# Patient Record
Sex: Male | Born: 1950 | Race: White | Hispanic: No | Marital: Single | Smoking: Current every day smoker
Health system: Southern US, Community
[De-identification: ages and names within clinical notes are randomized; demographics above are authoritative.]

## PROBLEM LIST (undated history)

## (undated) DIAGNOSIS — I509 Heart failure, unspecified: Secondary | ICD-10-CM

## (undated) DIAGNOSIS — I10 Essential (primary) hypertension: Secondary | ICD-10-CM

---

## 2021-08-17 ENCOUNTER — Emergency Department (HOSPITAL_COMMUNITY): Payer: Medicare HMO

## 2021-08-17 ENCOUNTER — Emergency Department (HOSPITAL_COMMUNITY)
Admission: EM | Admit: 2021-08-17 | Discharge: 2021-08-18 | Disposition: A | Discharge status: Home / Self Care | Payer: Medicare HMO | Attending: Emergency Medicine | Admitting: Emergency Medicine

## 2021-08-17 ENCOUNTER — Other Ambulatory Visit: Payer: Self-pay

## 2021-08-17 ENCOUNTER — Encounter (HOSPITAL_COMMUNITY): Payer: Self-pay | Admitting: Oncology

## 2021-08-17 DIAGNOSIS — R2243 Localized swelling, mass and lump, lower limb, bilateral: Secondary | ICD-10-CM | POA: Diagnosis not present

## 2021-08-17 DIAGNOSIS — I509 Heart failure, unspecified: Secondary | ICD-10-CM | POA: Diagnosis not present

## 2021-08-17 DIAGNOSIS — J3489 Other specified disorders of nose and nasal sinuses: Secondary | ICD-10-CM | POA: Diagnosis present

## 2021-08-17 DIAGNOSIS — F17210 Nicotine dependence, cigarettes, uncomplicated: Secondary | ICD-10-CM | POA: Insufficient documentation

## 2021-08-17 DIAGNOSIS — R220 Localized swelling, mass and lump, head: Secondary | ICD-10-CM | POA: Diagnosis not present

## 2021-08-17 DIAGNOSIS — I110 Hypertensive heart disease with heart failure: Secondary | ICD-10-CM | POA: Insufficient documentation

## 2021-08-17 HISTORY — DX: Heart failure, unspecified: I50.9

## 2021-08-17 HISTORY — DX: Essential (primary) hypertension: I10

## 2021-08-17 LAB — BRAIN NATRIURETIC PEPTIDE: B Natriuretic Peptide: 324.2 pg/mL — ABNORMAL HIGH (ref 0.0–100.0)

## 2021-08-17 LAB — COMPREHENSIVE METABOLIC PANEL
ALT: 13 U/L (ref 0–44)
AST: 20 U/L (ref 15–41)
Albumin: 3.7 g/dL (ref 3.5–5.0)
Alkaline Phosphatase: 79 U/L (ref 38–126)
Anion gap: 7 (ref 5–15)
BUN: 14 mg/dL (ref 8–23)
CO2: 26 mmol/L (ref 22–32)
Calcium: 9 mg/dL (ref 8.9–10.3)
Chloride: 107 mmol/L (ref 98–111)
Creatinine, Ser: 1.15 mg/dL (ref 0.61–1.24)
GFR, Estimated: >60 mL/min (ref >60)
Glucose, Bld: 102 mg/dL — ABNORMAL HIGH (ref 70–99)
Potassium: 4.2 mmol/L (ref 3.5–5.1)
Sodium: 140 mmol/L (ref 135–145)
Total Bilirubin: 1.2 mg/dL (ref 0.3–1.2)
Total Protein: 7.5 g/dL (ref 6.5–8.1)

## 2021-08-17 LAB — CBC WITH DIFFERENTIAL/PLATELET
Abs Immature Granulocytes: 0.02 10*3/uL (ref 0.00–0.07)
Basophils Absolute: 0 10*3/uL (ref 0.0–0.1)
Basophils Relative: 1 %
Eosinophils Absolute: 0.2 10*3/uL (ref 0.0–0.5)
Eosinophils Relative: 3 %
HCT: 35.6 % — ABNORMAL LOW (ref 39.0–52.0)
Hemoglobin: 11.2 g/dL — ABNORMAL LOW (ref 13.0–17.0)
Immature Granulocytes: 0 %
Lymphocytes Relative: 15 %
Lymphs Abs: 1 10*3/uL (ref 0.7–4.0)
MCH: 29.7 pg (ref 26.0–34.0)
MCHC: 31.5 g/dL (ref 30.0–36.0)
MCV: 94.4 fL (ref 80.0–100.0)
Monocytes Absolute: 0.4 10*3/uL (ref 0.1–1.0)
Monocytes Relative: 6 %
Neutro Abs: 5.2 10*3/uL (ref 1.7–7.7)
Neutrophils Relative %: 75 %
Platelets: 271 10*3/uL (ref 150–400)
RBC: 3.77 MIL/uL — ABNORMAL LOW (ref 4.22–5.81)
RDW: 14.1 % (ref 11.5–15.5)
WBC: 6.9 10*3/uL (ref 4.0–10.5)
nRBC: 0 % (ref 0.0–0.2)

## 2021-08-17 MED ORDER — VITAMIN D (ERGOCALCIFEROL) 1.25 MG (50000 UNIT) PO CAPS: 50000.0000 [IU] | ORAL_CAPSULE | ORAL | Status: DC

## 2021-08-17 MED ORDER — AMLODIPINE BESYLATE 5 MG PO TABS
5.0000 mg | ORAL_TABLET | Freq: Every day | ORAL | Status: DC
  Administered 2021-08-18: 5 mg via ORAL
  Filled 2021-08-17: qty 1

## 2021-08-17 MED ORDER — LISINOPRIL 20 MG PO TABS
40.0000 mg | ORAL_TABLET | Freq: Every day | ORAL | Status: DC
  Administered 2021-08-18: 40 mg via ORAL
  Filled 2021-08-17: qty 2

## 2021-08-17 MED ORDER — SERTRALINE HCL 50 MG PO TABS
100.0000 mg | ORAL_TABLET | Freq: Every day | ORAL | Status: DC
  Administered 2021-08-18: 100 mg via ORAL
  Filled 2021-08-17: qty 2

## 2021-08-17 MED ORDER — FUROSEMIDE 40 MG PO TABS: 20.0000 mg | ORAL_TABLET | Freq: Every day | ORAL | Status: DC | PRN
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
## 2021-08-17 NOTE — Progress Notes (Signed)
TOC CSW contacted [employer] of the [place] [phone].  They currently do not have availability in [place] or in [place].  Durene Dodge Tarpley-Carter, MSW, LCSW-A Pronouns:  She/Her/Hers [facility] Care Clinical Social Worker Direct Number:  [phone] [email or web address]
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
## 2021-08-18 DIAGNOSIS — J3489 Other specified disorders of nose and nasal sinuses: Secondary | ICD-10-CM | POA: Diagnosis not present

## 2021-08-18 MED ORDER — ACETAMINOPHEN 325 MG PO TABS: 650.0000 mg | ORAL_TABLET | Freq: Four times a day (QID) | ORAL | Status: DC | PRN

## 2021-08-18 MED ORDER — ONDANSETRON HCL 4 MG PO TABS: 4.0000 mg | ORAL_TABLET | Freq: Four times a day (QID) | ORAL | Status: DC | PRN

## 2021-08-18 MED ORDER — ONDANSETRON HCL 4 MG/2ML IJ SOLN: 4.0000 mg | Freq: Four times a day (QID) | INTRAMUSCULAR | Status: DC | PRN

## 2021-08-18 MED ORDER — TRAZODONE HCL 50 MG PO TABS: 25.0000 mg | ORAL_TABLET | Freq: Every evening | ORAL | Status: DC | PRN

## 2021-08-18 MED ORDER — ACETAMINOPHEN 650 MG RE SUPP: 650.0000 mg | Freq: Four times a day (QID) | RECTAL | Status: DC | PRN

## 2021-08-18 NOTE — ED Notes (Signed)
Pt provided with coffee and a phone. Alert oriented and comfortable.

## 2021-08-18 NOTE — ED Notes (Signed)
Social worker at bedside.
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
## 2021-08-18 NOTE — ED Provider Notes (Signed)
SW contacted friend who will come pick up patient, provide ride back to hotel to gather things and take him to friend's place.  Shelter resources provided to patient as well.   Terald Sleeper, MD 08/18/21 [phone]
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
## 2023-10-13 ENCOUNTER — Emergency Department (HOSPITAL_COMMUNITY)
Admission: EM | Admit: 2023-10-13 | Discharge: 2023-10-13 | Disposition: A | Discharge status: Home / Self Care | Payer: 59 | Attending: Emergency Medicine | Admitting: Emergency Medicine

## 2023-10-13 ENCOUNTER — Emergency Department (HOSPITAL_COMMUNITY): Payer: 59

## 2023-10-13 DIAGNOSIS — Z79899 Other long term (current) drug therapy: Secondary | ICD-10-CM | POA: Diagnosis not present

## 2023-10-13 DIAGNOSIS — Z720 Tobacco use: Secondary | ICD-10-CM

## 2023-10-13 DIAGNOSIS — R0602 Shortness of breath: Secondary | ICD-10-CM | POA: Diagnosis present

## 2023-10-13 DIAGNOSIS — Z7901 Long term (current) use of anticoagulants: Secondary | ICD-10-CM | POA: Diagnosis not present

## 2023-10-13 DIAGNOSIS — J40 Bronchitis, not specified as acute or chronic: Secondary | ICD-10-CM | POA: Diagnosis not present

## 2023-10-13 DIAGNOSIS — I110 Hypertensive heart disease with heart failure: Secondary | ICD-10-CM | POA: Insufficient documentation

## 2023-10-13 DIAGNOSIS — I509 Heart failure, unspecified: Secondary | ICD-10-CM | POA: Diagnosis not present

## 2023-10-13 DIAGNOSIS — F17200 Nicotine dependence, unspecified, uncomplicated: Secondary | ICD-10-CM | POA: Diagnosis not present

## 2023-10-13 DIAGNOSIS — Z20822 Contact with and (suspected) exposure to covid-19: Secondary | ICD-10-CM | POA: Diagnosis not present

## 2023-10-13 LAB — CBC WITH DIFFERENTIAL/PLATELET
Abs Immature Granulocytes: 0.01 10*3/uL (ref 0.00–0.07)
Basophils Absolute: 0 10*3/uL (ref 0.0–0.1)
Basophils Relative: 1 %
Eosinophils Absolute: 0.2 10*3/uL (ref 0.0–0.5)
Eosinophils Relative: 3 %
HCT: 33 % — ABNORMAL LOW (ref 39.0–52.0)
Hemoglobin: 10.5 g/dL — ABNORMAL LOW (ref 13.0–17.0)
Immature Granulocytes: 0 %
Lymphocytes Relative: 24 %
Lymphs Abs: 1.1 10*3/uL (ref 0.7–4.0)
MCH: 31 pg (ref 26.0–34.0)
MCHC: 31.8 g/dL (ref 30.0–36.0)
MCV: 97.3 fL (ref 80.0–100.0)
Monocytes Absolute: 0.4 10*3/uL (ref 0.1–1.0)
Monocytes Relative: 7 %
Neutro Abs: 3 10*3/uL (ref 1.7–7.7)
Neutrophils Relative %: 65 %
Platelets: 211 10*3/uL (ref 150–400)
RBC: 3.39 MIL/uL — ABNORMAL LOW (ref 4.22–5.81)
RDW: 13.7 % (ref 11.5–15.5)
WBC: 4.7 10*3/uL (ref 4.0–10.5)
nRBC: 0 % (ref 0.0–0.2)

## 2023-10-13 LAB — COMPREHENSIVE METABOLIC PANEL
ALT: 10 U/L (ref 0–44)
AST: 15 U/L (ref 15–41)
Albumin: 3.3 g/dL — ABNORMAL LOW (ref 3.5–5.0)
Alkaline Phosphatase: 53 U/L (ref 38–126)
Anion gap: 8 (ref 5–15)
BUN: 16 mg/dL (ref 8–23)
CO2: 26 mmol/L (ref 22–32)
Calcium: 9.1 mg/dL (ref 8.9–10.3)
Chloride: 104 mmol/L (ref 98–111)
Creatinine, Ser: 1.1 mg/dL (ref 0.61–1.24)
GFR, Estimated: >60 mL/min (ref >60)
Glucose, Bld: 92 mg/dL (ref 70–99)
Potassium: 4.5 mmol/L (ref 3.5–5.1)
Sodium: 138 mmol/L (ref 135–145)
Total Bilirubin: 0.7 mg/dL (ref 0.0–1.2)
Total Protein: 6.8 g/dL (ref 6.5–8.1)

## 2023-10-13 LAB — RESP PANEL BY RT-PCR (RSV, FLU A&B, COVID)  RVPGX2
Influenza A by PCR: NEGATIVE
Influenza B by PCR: NEGATIVE
Resp Syncytial Virus by PCR: NEGATIVE
SARS Coronavirus 2 by RT PCR: NEGATIVE

## 2023-10-13 MED ORDER — METHYLPREDNISOLONE SODIUM SUCC 125 MG IJ SOLR
125.0000 mg | Freq: Once | INTRAMUSCULAR | Status: AC
  Administered 2023-10-13: 125 mg via INTRAVENOUS
  Filled 2023-10-13: qty 2

## 2023-10-13 MED ORDER — PREDNISONE 50 MG PO TABS: 50.0000 mg | ORAL_TABLET | Freq: Every day | ORAL | 0 refills | Status: DC

## 2023-10-13 MED ORDER — GUAIFENESIN 100 MG/5ML PO LIQD: 100.0000 mg | ORAL | 0 refills | Status: DC | PRN

## 2023-10-13 MED ORDER — GUAIFENESIN-CODEINE 100-10 MG/5ML PO SOLN: 5.0000 mL | Freq: Three times a day (TID) | ORAL | 0 refills | Status: AC | PRN

## 2023-10-13 MED ORDER — DOXYCYCLINE HYCLATE 100 MG PO CAPS: 100.0000 mg | ORAL_CAPSULE | Freq: Two times a day (BID) | ORAL | 0 refills | Status: DC

## 2023-10-13 MED ORDER — DOXYCYCLINE HYCLATE 100 MG PO CAPS: 100.0000 mg | ORAL_CAPSULE | Freq: Two times a day (BID) | ORAL | 0 refills | Status: AC

## 2023-10-13 MED ORDER — ALBUTEROL SULFATE HFA 108 (90 BASE) MCG/ACT IN AERS: 2.0000 | INHALATION_SPRAY | RESPIRATORY_TRACT | Status: DC | PRN

## 2023-10-13 MED ORDER — IPRATROPIUM-ALBUTEROL 0.5-2.5 (3) MG/3ML IN SOLN
3.0000 mL | Freq: Once | RESPIRATORY_TRACT | Status: AC
  Administered 2023-10-13: 3 mL via RESPIRATORY_TRACT
  Filled 2023-10-13: qty 3

## 2023-10-13 MED ORDER — PREDNISONE 50 MG PO TABS: 50.0000 mg | ORAL_TABLET | Freq: Every day | ORAL | 0 refills | Status: AC

## 2023-10-13 MED ORDER — GUAIFENESIN 100 MG/5ML PO LIQD: 100.0000 mg | ORAL | 0 refills | Status: AC | PRN

## 2023-10-13 MED ORDER — DOXYCYCLINE HYCLATE 100 MG PO TABS
100.0000 mg | ORAL_TABLET | Freq: Once | ORAL | Status: AC
  Administered 2023-10-13: 100 mg via ORAL
  Filled 2023-10-13: qty 1

## 2023-10-13 NOTE — Discharge Instructions (Addendum)
Try to stop smoking. °

## 2023-10-13 NOTE — ED Triage Notes (Signed)
 Pt arrives via [facility] from hotel and reports for the last 1-2 weeks having URI symptoms including cough, congestion, malaise and SOB. Pt denies CP during triage. No recent fevers per pt.

## 2023-10-13 NOTE — ED Provider Notes (Signed)
 [facility] Provider Note   CSN: [id number] Arrival date & time: 10/13/23  9072     History  Chief Complaint  Patient presents with   URI   Shortness of Breath    Adam Baldwin is a 73 y.o. male.  Pt is a 73 yo male with pmhx significant for htn, chf, schizophrenia, seizures, and homeless.  Pt has been living in a hotel and has been coughing for 2 weeks.  He denies fevers.  He does smoke. No known sick contracts.         Home Medications Prior to Admission medications   Medication Sig Start Date End Date Taking? Authorizing Provider  guaiFENesin -codeine  100-10 MG/5ML syrup Take 5 mLs by mouth 3 (three) times daily as needed for cough. 10/13/23  Yes Dean Clarity, MD  amLODipine  (NORVASC ) 5 MG tablet Take 5 mg by mouth daily. 03/08/21   [provider]  doxycycline  (VIBRAMYCIN ) 100 MG capsule Take 1 capsule (100 mg total) by mouth 2 (two) times daily. 10/13/23   Dean Clarity, MD  ELIQUIS 5 MG TABS tablet Take 5 mg by mouth 2 (two) times daily. 04/03/21   [provider]  ergocalciferol  (VITAMIN D2) 1.25 MG (50000 UT) capsule Take 50,000 Units by mouth every Tuesday. 06/21/19   [provider]  furosemide  (LASIX ) 20 MG tablet Take 20 mg by mouth daily as needed (for swelling in legs/feet). 08/11/21   [provider]  guaiFENesin  (ROBITUSSIN) 100 MG/5ML liquid Take 5-10 mLs (100-200 mg total) by mouth every 4 (four) hours as needed for cough or to loosen phlegm. 10/13/23   Dean Clarity, MD  lisinopril  (ZESTRIL ) 40 MG tablet Take 40 mg by mouth daily. 06/10/21   [provider]  predniSONE  (DELTASONE ) 50 MG tablet Take 1 tablet (50 mg total) by mouth daily with breakfast. 10/13/23   Dean Clarity, MD  sertraline  (ZOLOFT ) 100 MG tablet Take 100 mg by mouth daily. 08/13/21   [provider]      Allergies    Patient has no known allergies.    Review of Systems   Review of Systems   Respiratory:  Positive for cough and shortness of breath.   All other systems reviewed and are negative.   Physical Exam Updated Vital Signs BP (!) 185/83 (BP Location: Right Arm)   Pulse 61   Temp 98.1 F (36.7 C)   Resp 18   SpO2 100%  Physical Exam Vitals and nursing note reviewed.  Constitutional:      Appearance: He is well-developed.  HENT:     Head: Normocephalic and atraumatic.     Mouth/Throat:     Mouth: Mucous membranes are moist.     Pharynx: Oropharynx is clear.  Eyes:     Extraocular Movements: Extraocular movements intact.     Pupils: Pupils are equal, round, and reactive to light.  Cardiovascular:     Rate and Rhythm: Normal rate and regular rhythm.  Pulmonary:     Effort: Pulmonary effort is normal.     Breath sounds: Wheezing present.  Abdominal:     General: Bowel sounds are normal.     Palpations: Abdomen is soft.  Musculoskeletal:        General: Normal range of motion.     Cervical back: Normal range of motion and neck supple.  Skin:    General: Skin is warm.     Capillary Refill: Capillary refill takes less than 2 seconds.  Neurological:  General: No focal deficit present.     Mental Status: He is alert and oriented to person, place, and time.  Psychiatric:        Mood and Affect: Mood normal.        Behavior: Behavior normal.     ED Results / Procedures / Treatments   Labs (all labs ordered are listed, but only abnormal results are displayed) Labs Reviewed  COMPREHENSIVE METABOLIC PANEL - Abnormal; Notable for the following components:      Result Value   Albumin 3.3 (*)    All other components within normal limits  CBC WITH DIFFERENTIAL/PLATELET - Abnormal; Notable for the following components:   RBC 3.39 (*)    Hemoglobin 10.5 (*)    HCT 33.0 (*)    All other components within normal limits  RESP PANEL BY RT-PCR (RSV, FLU A&B, COVID)  [id number]    EKG EKG Interpretation Date/Time:  Sunday October 13 2023 09:44:59  EST Ventricular Rate:  60 PR Interval:  148 QRS Duration:  94 QT Interval:  428 QTC Calculation: 428 R Axis:   75  Text Interpretation: Sinus rhythm with marked sinus arrhythmia Otherwise normal ECG No previous ECGs available Confirmed by Dean Clarity [phone]) on 10/13/2023 11:33:38 AM  Radiology DG Chest 2 View Result Date: 10/13/2023 CLINICAL DATA:  Shortness of breath.  URI EXAM: CHEST - 2 VIEW COMPARISON:  08/17/2021 FINDINGS: Hyperinflation on the lateral view. There is no edema, consolidation, effusion, or pneumothorax. Normal heart size and mediastinal contours. IMPRESSION: No active cardiopulmonary disease. Electronically Signed   By: Dorn Roulette M.D.   On: 10/13/2023 10:29    Procedures Procedures    Medications Ordered in ED Medications  methylPREDNISolone  sodium succinate (SOLU-MEDROL ) 125 mg/2 mL injection 125 mg (125 mg Intravenous Given 10/13/23 1223)  doxycycline  (VIBRA -TABS) tablet 100 mg (100 mg Oral Given 10/13/23 1224)  ipratropium-albuterol  (DUONEB) 0.5-2.5 (3) MG/3ML nebulizer solution 3 mL (3 mLs Nebulization Given 10/13/23 1217)    ED Course/ Medical Decision Making/ A&P                                 Medical Decision Making Amount and/or Complexity of Data Reviewed Labs: ordered. Radiology: ordered.  Risk OTC drugs. Prescription drug management.   This patient presents to the ED for concern of sob, this involves an extensive number of treatment options, and is a complaint that carries with it a high risk of complications and morbidity.  The differential diagnosis includes covid/flu/rsv, pna, bronchitis, copd exac   Co morbidities that complicate the patient evaluation  htn, chf, schizophrenia, seizures, and homeless   Additional history obtained:  Additional history obtained from epic chart review  Lab Tests:  I Ordered, and personally interpreted labs.  The pertinent results include:  covid/flu/rsv neg   Imaging Studies ordered:  I  ordered imaging studies including cxr  I independently visualized and interpreted imaging which showed No active cardiopulmonary disease.  I agree with the radiologist interpretation   Medicines ordered and prescription drug management:  I ordered medication including solumedrol, doxy, duoneb  for sx  Reevaluation of the patient after these medicines showed that the patient improved I have reviewed the patients home medicines and have made adjustments as needed  Problem List / ED Course:  Bronchitis:  sx have been going on for 2 weeks, so he is d/c with doxy.  I have also encouraged him to stop smoking.  Return if worse.  F/u with pcp.   Reevaluation:  After the interventions noted above, I reevaluated the patient and found that they have :improved   Social Determinants of Health:  Living in a hotel   Dispostion:  After consideration of the diagnostic results and the patients response to treatment, I feel that the patent would benefit from discharge with outpatient f/u.          Final Clinical Impression(s) / ED Diagnoses Final diagnoses:  Bronchitis  Tobacco abuse    Rx / DC Orders ED Discharge Orders          Ordered    predniSONE  (DELTASONE ) 50 MG tablet  Daily with breakfast,   Status:  Discontinued        10/13/23 1333    doxycycline  (VIBRAMYCIN ) 100 MG capsule  2 times daily,   Status:  Discontinued        10/13/23 1333    guaiFENesin -codeine  100-10 MG/5ML syrup  3 times daily PRN        10/13/23 1333    doxycycline  (VIBRAMYCIN ) 100 MG capsule  2 times daily        10/13/23 1334    predniSONE  (DELTASONE ) 50 MG tablet  Daily with breakfast,   Status:  Discontinued        10/13/23 1334    guaiFENesin  (ROBITUSSIN) 100 MG/5ML liquid  Every 4 hours PRN,   Status:  Discontinued        10/13/23 1334    predniSONE  (DELTASONE ) 50 MG tablet  Daily with breakfast,   Status:  Discontinued        10/13/23 1335    guaiFENesin  (ROBITUSSIN) 100 MG/5ML liquid  Every 4  hours PRN        10/13/23 1335    predniSONE  (DELTASONE ) 50 MG tablet  Daily with breakfast        10/13/23 1336              Dean Clarity, MD 10/13/23 1337

## 2024-03-06 ENCOUNTER — Other Ambulatory Visit: Payer: Self-pay | Admitting: Family Medicine

## 2024-03-06 DIAGNOSIS — F17219 Nicotine dependence, cigarettes, with unspecified nicotine-induced disorders: Secondary | ICD-10-CM

## 2024-03-06 DIAGNOSIS — Z122 Encounter for screening for malignant neoplasm of respiratory organs: Secondary | ICD-10-CM
